# Patient Record
Sex: Female | Born: 1965 | Race: White | Hispanic: No | Marital: Married | Smoking: Never smoker
Health system: Southern US, Community
[De-identification: ages and names within clinical notes are randomized; demographics above are authoritative.]

---

## 2006-12-24 ENCOUNTER — Inpatient Hospital Stay (HOSPITAL_COMMUNITY): Admission: AD | Admit: 2006-12-24 | Discharge: 2006-12-26 | Payer: Self-pay | Admitting: Obstetrics & Gynecology

## 2007-02-04 ENCOUNTER — Ambulatory Visit: Admission: RE | Admit: 2007-02-04 | Discharge: 2007-02-04 | Payer: Self-pay | Admitting: Obstetrics & Gynecology

## 2008-11-25 ENCOUNTER — Inpatient Hospital Stay (HOSPITAL_COMMUNITY): Admission: AD | Admit: 2008-11-25 | Discharge: 2008-11-25 | Payer: Self-pay | Admitting: Obstetrics

## 2008-11-27 ENCOUNTER — Encounter (INDEPENDENT_AMBULATORY_CARE_PROVIDER_SITE_OTHER): Payer: Self-pay | Admitting: Obstetrics

## 2008-11-27 ENCOUNTER — Inpatient Hospital Stay (HOSPITAL_COMMUNITY): Admission: AD | Admit: 2008-11-27 | Discharge: 2008-11-27 | Payer: Self-pay | Admitting: Obstetrics

## 2009-08-26 ENCOUNTER — Inpatient Hospital Stay (HOSPITAL_COMMUNITY): Admission: AD | Admit: 2009-08-26 | Discharge: 2009-08-28 | Payer: Self-pay | Admitting: Obstetrics and Gynecology

## 2009-08-29 ENCOUNTER — Encounter: Admission: RE | Admit: 2009-08-29 | Discharge: 2009-09-28 | Payer: Self-pay | Admitting: Obstetrics & Gynecology

## 2009-09-29 ENCOUNTER — Encounter: Admission: RE | Admit: 2009-09-29 | Discharge: 2009-10-19 | Payer: Self-pay | Admitting: Certified Nurse Midwife

## 2011-02-08 LAB — CBC
HCT: 29.9 % — ABNORMAL LOW (ref 36.0–46.0)
MCHC: 34.1 g/dL (ref 30.0–36.0)
MCV: 99.5 fL (ref 78.0–100.0)
MCV: 99.6 fL (ref 78.0–100.0)
Platelets: 182 10*3/uL (ref 150–400)
RBC: 3.83 MIL/uL — ABNORMAL LOW (ref 3.87–5.11)
RDW: 13.3 % (ref 11.5–15.5)
RDW: 13.5 % (ref 11.5–15.5)

## 2011-02-08 LAB — RPR: RPR Ser Ql: NONREACTIVE

## 2011-02-19 LAB — HCG, QUANTITATIVE, PREGNANCY: hCG, Beta Chain, Quant, S: 1994 m[IU]/mL — ABNORMAL HIGH (ref <5)

## 2012-05-26 ENCOUNTER — Other Ambulatory Visit: Payer: Self-pay | Admitting: Dermatology

## 2013-05-06 ENCOUNTER — Other Ambulatory Visit: Payer: Self-pay | Admitting: Dermatology

## 2014-09-29 ENCOUNTER — Other Ambulatory Visit: Payer: Self-pay | Admitting: Dermatology

## 2019-07-10 ENCOUNTER — Other Ambulatory Visit: Payer: Self-pay

## 2019-07-10 ENCOUNTER — Emergency Department (HOSPITAL_COMMUNITY): Payer: BC Managed Care – PPO

## 2019-07-10 ENCOUNTER — Encounter (HOSPITAL_COMMUNITY): Payer: Self-pay | Admitting: Obstetrics and Gynecology

## 2019-07-10 ENCOUNTER — Inpatient Hospital Stay (HOSPITAL_COMMUNITY)
Admission: EM | Admit: 2019-07-10 | Discharge: 2019-07-18 | DRG: 066 | Disposition: A | Discharge status: Home / Self Care | Payer: BC Managed Care – PPO | Attending: Neurosurgery | Admitting: Neurosurgery

## 2019-07-10 DIAGNOSIS — Z23 Encounter for immunization: Secondary | ICD-10-CM | POA: Diagnosis not present

## 2019-07-10 DIAGNOSIS — R51 Headache: Secondary | ICD-10-CM | POA: Diagnosis present

## 2019-07-10 DIAGNOSIS — I609 Nontraumatic subarachnoid hemorrhage, unspecified: Principal | ICD-10-CM | POA: Diagnosis present

## 2019-07-10 DIAGNOSIS — Z793 Long term (current) use of hormonal contraceptives: Secondary | ICD-10-CM | POA: Diagnosis not present

## 2019-07-10 DIAGNOSIS — Z79899 Other long term (current) drug therapy: Secondary | ICD-10-CM | POA: Diagnosis not present

## 2019-07-10 DIAGNOSIS — Z20828 Contact with and (suspected) exposure to other viral communicable diseases: Secondary | ICD-10-CM | POA: Diagnosis present

## 2019-07-10 LAB — CBC
HCT: 41 % (ref 36.0–46.0)
Hemoglobin: 13.8 g/dL (ref 12.0–15.0)
MCH: 32.5 pg (ref 26.0–34.0)
MCHC: 33.7 g/dL (ref 30.0–36.0)
MCV: 96.7 fL (ref 80.0–100.0)
Platelets: 212 10*3/uL (ref 150–400)
RBC: 4.24 MIL/uL (ref 3.87–5.11)
RDW: 12.6 % (ref 11.5–15.5)
WBC: 8.1 10*3/uL (ref 4.0–10.5)
nRBC: 0 % (ref 0.0–0.2)

## 2019-07-10 LAB — BASIC METABOLIC PANEL
Anion gap: 7 (ref 5–15)
BUN: 19 mg/dL (ref 6–20)
CO2: 26 mmol/L (ref 22–32)
Calcium: 9.2 mg/dL (ref 8.9–10.3)
Chloride: 105 mmol/L (ref 98–111)
Creatinine, Ser: 0.88 mg/dL (ref 0.44–1.00)
GFR calc Af Amer: >60 mL/min (ref >60)
GFR calc non Af Amer: >60 mL/min (ref >60)
Glucose, Bld: 127 mg/dL — ABNORMAL HIGH (ref 70–99)
Potassium: 3.8 mmol/L (ref 3.5–5.1)
Sodium: 138 mmol/L (ref 135–145)

## 2019-07-10 LAB — PROTIME-INR
INR: 1.1 (ref 0.8–1.2)
Prothrombin Time: 14.2 seconds (ref 11.4–15.2)

## 2019-07-10 LAB — SARS CORONAVIRUS 2 BY RT PCR (HOSPITAL ORDER, PERFORMED IN ~~LOC~~ HOSPITAL LAB): SARS Coronavirus 2: NEGATIVE

## 2019-07-10 LAB — MRSA PCR SCREENING: MRSA by PCR: NEGATIVE

## 2019-07-10 MED ORDER — IOHEXOL 350 MG/ML SOLN
80.0000 mL | Freq: Once | INTRAVENOUS | Status: AC | PRN
  Administered 2019-07-10: 100 mL via INTRAVENOUS

## 2019-07-10 MED ORDER — MORPHINE SULFATE (PF) 4 MG/ML IV SOLN
4.0000 mg | Freq: Once | INTRAVENOUS | Status: AC
  Administered 2019-07-10: 4 mg via INTRAVENOUS
  Filled 2019-07-10: qty 1

## 2019-07-10 MED ORDER — ONDANSETRON HCL 4 MG/2ML IJ SOLN
4.0000 mg | Freq: Four times a day (QID) | INTRAMUSCULAR | Status: DC | PRN
  Administered 2019-07-15 – 2019-07-17 (×2): 4 mg via INTRAVENOUS
  Filled 2019-07-10 (×2): qty 2

## 2019-07-10 MED ORDER — MORPHINE SULFATE (PF) 4 MG/ML IV SOLN
4.0000 mg | Freq: Once | INTRAVENOUS | Status: AC
  Administered 2019-07-10: 15:00:00 4 mg via INTRAVENOUS
  Filled 2019-07-10: qty 1

## 2019-07-10 MED ORDER — SODIUM CHLORIDE 0.9 % IV SOLN
INTRAVENOUS | Status: DC
  Administered 2019-07-11 – 2019-07-17 (×7): via INTRAVENOUS

## 2019-07-10 MED ORDER — DIPHENHYDRAMINE HCL 50 MG/ML IJ SOLN
12.5000 mg | Freq: Once | INTRAMUSCULAR | Status: AC
  Administered 2019-07-10: 12.5 mg via INTRAVENOUS
  Filled 2019-07-10: qty 1

## 2019-07-10 MED ORDER — HYDROCODONE-ACETAMINOPHEN 5-325 MG PO TABS
1.0000 | ORAL_TABLET | ORAL | Status: DC | PRN
  Administered 2019-07-10 – 2019-07-15 (×19): 1 via ORAL
  Administered 2019-07-15 (×2): 2 via ORAL
  Administered 2019-07-15: 05:00:00 1 via ORAL
  Administered 2019-07-16 – 2019-07-18 (×11): 2 via ORAL
  Filled 2019-07-10 (×3): qty 2
  Filled 2019-07-10: qty 1
  Filled 2019-07-10: qty 2
  Filled 2019-07-10 (×3): qty 1
  Filled 2019-07-10: qty 2
  Filled 2019-07-10: qty 1
  Filled 2019-07-10: qty 2
  Filled 2019-07-10 (×3): qty 1
  Filled 2019-07-10: qty 2
  Filled 2019-07-10: qty 1
  Filled 2019-07-10 (×2): qty 2
  Filled 2019-07-10 (×5): qty 1
  Filled 2019-07-10: qty 2
  Filled 2019-07-10 (×3): qty 1
  Filled 2019-07-10 (×3): qty 2
  Filled 2019-07-10 (×3): qty 1
  Filled 2019-07-10 (×3): qty 2

## 2019-07-10 MED ORDER — PROCHLORPERAZINE EDISYLATE 10 MG/2ML IJ SOLN
10.0000 mg | Freq: Once | INTRAMUSCULAR | Status: AC
  Administered 2019-07-10: 10 mg via INTRAVENOUS
  Filled 2019-07-10: qty 2

## 2019-07-10 MED ORDER — SENNA 8.6 MG PO TABS
1.0000 | ORAL_TABLET | Freq: Two times a day (BID) | ORAL | Status: DC
  Administered 2019-07-10 – 2019-07-18 (×10): 8.6 mg via ORAL
  Filled 2019-07-10 (×11): qty 1

## 2019-07-10 MED ORDER — ACETAMINOPHEN 325 MG PO TABS
650.0000 mg | ORAL_TABLET | Freq: Four times a day (QID) | ORAL | Status: DC | PRN
  Administered 2019-07-16: 09:00:00 650 mg via ORAL
  Filled 2019-07-10 (×2): qty 2

## 2019-07-10 MED ORDER — ACETAMINOPHEN 650 MG RE SUPP: 650.0000 mg | Freq: Four times a day (QID) | RECTAL | Status: DC | PRN

## 2019-07-10 MED ORDER — NIMODIPINE 30 MG PO CAPS
30.0000 mg | ORAL_CAPSULE | ORAL | Status: DC
  Administered 2019-07-10 – 2019-07-11 (×5): 30 mg via ORAL
  Filled 2019-07-10 (×5): qty 1

## 2019-07-10 MED ORDER — DOCUSATE SODIUM 100 MG PO CAPS
100.0000 mg | ORAL_CAPSULE | Freq: Two times a day (BID) | ORAL | Status: DC
  Administered 2019-07-10 – 2019-07-18 (×12): 100 mg via ORAL
  Filled 2019-07-10 (×13): qty 1

## 2019-07-10 MED ORDER — MORPHINE SULFATE (PF) 2 MG/ML IV SOLN
2.0000 mg | INTRAVENOUS | Status: DC | PRN
  Administered 2019-07-15 – 2019-07-16 (×2): 2 mg via INTRAVENOUS
  Filled 2019-07-10 (×2): qty 1

## 2019-07-10 MED ORDER — SODIUM CHLORIDE 0.9 % IV SOLN
INTRAVENOUS | Status: DC
  Administered 2019-07-10: 14:00:00 via INTRAVENOUS

## 2019-07-10 MED ORDER — CHLORHEXIDINE GLUCONATE CLOTH 2 % EX PADS
6.0000 | MEDICATED_PAD | Freq: Every day | CUTANEOUS | Status: DC
  Administered 2019-07-10 – 2019-07-16 (×5): 6 via TOPICAL

## 2019-07-10 MED ORDER — ONDANSETRON HCL 4 MG PO TABS: 4.0000 mg | ORAL_TABLET | Freq: Four times a day (QID) | ORAL | Status: DC | PRN

## 2019-07-10 NOTE — ED Notes (Signed)
[facility] called for patient transport to [facility].

## 2019-07-10 NOTE — ED Provider Notes (Signed)
[facility]-EMERGENCY DEPT Provider Note   CSN: [id number] Arrival date & time: 07/10/19  1216     History   Chief Complaint Chief Complaint  Patient presents with   Headache   Nausea   Neck Pain    HPI Ashley Case is a 53 y.o. female.     HPI Pt was talking on the phone when she had sudden onset of severe pressure in her head around 11 am today.  Prior to that she was completely fine. .  It is in the back of her head and goes down her neck.  She has felt nauseated.  No vomiting.  Some photophobia.  No trouble walking.  No trouble moving arms or legs.  Her upper arms feel funny but they are not numb. She has had trouble with intermittent headaches but not a consistent problem.  She has not had one in at least a month or two.   History reviewed. No pertinent past medical history.  Patient Active Problem List   Diagnosis Date Noted   SAH (subarachnoid hemorrhage) (HCC) 07/10/2019    History reviewed. No pertinent surgical history.   OB History   No obstetric history on file.      Home Medications    Prior to Admission medications   Medication Sig Start Date End Date Taking? Authorizing Provider  ibuprofen (ADVIL) 200 MG tablet Take 400 mg by mouth every 6 (six) hours as needed for fever, headache or moderate pain.   Yes [provider]  LO LOESTRIN FE 1 MG-10 MCG / 10 MCG tablet Take 1 tablet by mouth daily. 06/02/19  Yes [provider]  Multiple Vitamin (MULTIVITAMIN WITH MINERALS) TABS tablet Take 2 tablets by mouth daily.   Yes [provider]    Family History History reviewed. No pertinent family history.  Social History Social History   Tobacco Use   Smoking status: Never Smoker   Smokeless tobacco: Never Used  Substance Use Topics   Alcohol use: Yes    Comment: Daily wine/beer with dinner   Drug use: Never     Allergies   Patient has no known allergies.   Review of Systems Review of  Systems  Constitutional: Negative for fever.  Respiratory: Negative for cough.   Musculoskeletal: Positive for neck pain.  Neurological: Positive for light-headedness and headaches.  All other systems reviewed and are negative.    Physical Exam Updated Vital Signs BP 119/69 (BP Location: Right Arm)    Pulse 66    Temp 97.6 F (36.4 C) (Oral)    Resp 16    Wt 52.2 kg    SpO2 100%   Physical Exam Vitals signs and nursing note reviewed.  Constitutional:      General: She is not in acute distress.    Appearance: She is well-developed.  HENT:     Head: Normocephalic and atraumatic.     Right Ear: External ear normal.     Left Ear: External ear normal.  Eyes:     General: No scleral icterus.       Right eye: No discharge.        Left eye: No discharge.     Conjunctiva/sclera: Conjunctivae normal.  Neck:     Musculoskeletal: Neck supple.     Trachea: No tracheal deviation.     Comments: Decreased range of motion Cardiovascular:     Rate and Rhythm: Normal rate and regular rhythm.  Pulmonary:     Effort: Pulmonary effort  is normal. No respiratory distress.     Breath sounds: Normal breath sounds. No stridor. No wheezing or rales.  Abdominal:     General: Bowel sounds are normal. There is no distension.     Palpations: Abdomen is soft.     Tenderness: There is no abdominal tenderness. There is no guarding or rebound.  Musculoskeletal:        General: No tenderness.  Skin:    General: Skin is warm and dry.     Findings: No rash.  Neurological:     Mental Status: She is alert.     Cranial Nerves: No cranial nerve deficit (no facial droop, extraocular movements intact, no slurred speech).     Sensory: No sensory deficit.     Motor: No abnormal muscle tone or seizure activity.     Coordination: Coordination normal.      ED Treatments / Results  Labs (all labs ordered are listed, but only abnormal results are displayed) Labs Reviewed  BASIC METABOLIC PANEL - Abnormal;  Notable for the following components:      Result Value   Glucose, Bld 127 (*)    All other components within normal limits  SARS CORONAVIRUS 2 (HOSPITAL ORDER, [facility])  CBC  PROTIME-INR  HIV ANTIBODY (ROUTINE TESTING W REFLEX)    EKG None  Radiology Ct Angio Head W Or Wo Contrast  Result Date: 07/10/2019 CLINICAL DATA:  Subarachnoid hemorrhage, severe headache with nausea EXAM: CT ANGIOGRAPHY HEAD AND NECK TECHNIQUE: Multidetector CT imaging of the head and neck was performed using the standard protocol during bolus administration of intravenous contrast. Multiplanar CT image reconstructions and MIPs were obtained to evaluate the vascular anatomy. Carotid stenosis measurements (when applicable) are obtained utilizing NASCET criteria, using the distal internal carotid diameter as the denominator. CONTRAST:  174mL OMNIPAQUE IOHEXOL 350 MG/ML SOLN COMPARISON:  Concurrent noncontrast head CT 07/10/2019. FINDINGS: CTA NECK FINDINGS Aortic arch: The left vertebral artery arises directly from the aortic arch. The imaged aortic arch is otherwise unremarkable. No significant ostial stenosis of the major branch vessel origins. Right carotid system: The right common and cervical internal carotid arteries are patent without significant stenosis. There is only minimal calcified plaque at the carotid bifurcation. Left carotid system: The left common and cervical internal carotid arteries are patent without significant stenosis Vertebral arteries: The bilateral vertebral arteries are patent within the neck without significant stenosis. The vertebral arteries are codominant. Skeleton: No suspicious lytic or blastic osseous lesions. Cervical spondylosis with C5-C6 and C6-C7 posterior disc osteophytes. No high-grade bony spinal canal stenosis. Other neck: 7 mm nodule within the inferior right thyroid lobe, not meeting consensus criteria for ultrasound imaging follow-up. No soft tissue  neck mass or pathologically enlarged cervical chain lymph nodes. Upper chest: No confluent consolidation within the imaged lung apices. Review of the MIP images confirms the above findings CTA HEAD FINDINGS Anterior circulation: The intracranial internal carotid arteries are patent without significant stenosis. The right middle and anterior cerebral arteries are patent without significant proximal stenosis. The left middle and anterior cerebral arteries are patent without significant proximal stenosis. No intracranial aneurysm is identified. Posterior circulation: The intracranial vertebral arteries are patent without significant stenosis, as is the basilar artery. The bilateral posterior cerebral arteries are patent without significant proximal stenosis. Venous sinuses: Within the limitations of contrast timing, no evidence of thrombosis. Anatomic variants: None significant. Posterior communicating arteries are present bilaterally. Review of the MIP images confirms the above findings  IMPRESSION: CTA head: Unremarkable CT angiography of the head. No culprit aneurysm for subarachnoid hemorrhage is identified. Consider catheter based angiography for further evaluation. No large vessel occlusion or proximal high-grade arterial stenosis. CTA neck: The common carotid, internal carotid and vertebral arteries are widely patent within the neck bilaterally. No aneurysm or significant vessel irregularity. Electronically Signed   By: Jackey LogeKyle  Golden   On: 07/10/2019 15:29   Ct Head Wo Contrast  Addendum Date: 07/10/2019   ADDENDUM REPORT: 07/10/2019 15:30 ADDENDUM: Critical Value/emergent results were called by telephone at the time of interpretation on 07/10/2019 at 3:21 pm to Dr. Linwood DibblesJON Elynor Kallenberger , who verbally acknowledged these results. Electronically Signed   By: Genevive BiStewart  Edmunds M.D.   On: 07/10/2019 15:30   Result Date: 07/10/2019 CLINICAL DATA:  Severe headache no history of migrainesHeadache, acute, severe, worst HA of life  onset around 11 am EXAM: CT HEAD WITHOUT CONTRAST TECHNIQUE: Contiguous axial images were obtained from the base of the skull through the vertex without intravenous contrast. COMPARISON:  None. FINDINGS: Brain: There is high-density extra-axial blood along the quadrigeminal plate cisterns and along the suprasellar cistern. Findings consistent with acute subarachnoid hemorrhage. No intraventricular hemorrhage. No parenchymal hemorrhage identified. Hydrocephalus. Vascular: No hypodense enlarged vessel identified. Skull: Normal. Negative for fracture or focal lesion. Sinuses/Orbits: Paranasal sinuses and mastoid air cells are clear. Orbits are clear. Other: None. IMPRESSION: 1. Acute subarachnoid hemorrhage in the basal cisterns. Patient subsequently underwent a same day CT angiogram. Recommend attention to findings of the subsequent study. 2. No hydrocephalus. No intraventricular hemorrhage. No parenchymal hemorrhage. Electronically Signed: By: Genevive BiStewart  Edmunds M.D. On: 07/10/2019 15:16   Ct Angio Neck W And/or Wo Contrast  Result Date: 07/10/2019 CLINICAL DATA:  Subarachnoid hemorrhage, severe headache with nausea EXAM: CT ANGIOGRAPHY HEAD AND NECK TECHNIQUE: Multidetector CT imaging of the head and neck was performed using the standard protocol during bolus administration of intravenous contrast. Multiplanar CT image reconstructions and MIPs were obtained to evaluate the vascular anatomy. Carotid stenosis measurements (when applicable) are obtained utilizing NASCET criteria, using the distal internal carotid diameter as the denominator. CONTRAST:  100mL OMNIPAQUE IOHEXOL 350 MG/ML SOLN COMPARISON:  Concurrent noncontrast head CT 07/10/2019. FINDINGS: CTA NECK FINDINGS Aortic arch: The left vertebral artery arises directly from the aortic arch. The imaged aortic arch is otherwise unremarkable. No significant ostial stenosis of the major branch vessel origins. Right carotid system: The right common and cervical  internal carotid arteries are patent without significant stenosis. There is only minimal calcified plaque at the carotid bifurcation. Left carotid system: The left common and cervical internal carotid arteries are patent without significant stenosis Vertebral arteries: The bilateral vertebral arteries are patent within the neck without significant stenosis. The vertebral arteries are codominant. Skeleton: No suspicious lytic or blastic osseous lesions. Cervical spondylosis with C5-C6 and C6-C7 posterior disc osteophytes. No high-grade bony spinal canal stenosis. Other neck: 7 mm nodule within the inferior right thyroid lobe, not meeting consensus criteria for ultrasound imaging follow-up. No soft tissue neck mass or pathologically enlarged cervical chain lymph nodes. Upper chest: No confluent consolidation within the imaged lung apices. Review of the MIP images confirms the above findings CTA HEAD FINDINGS Anterior circulation: The intracranial internal carotid arteries are patent without significant stenosis. The right middle and anterior cerebral arteries are patent without significant proximal stenosis. The left middle and anterior cerebral arteries are patent without significant proximal stenosis. No intracranial aneurysm is identified. Posterior circulation: The intracranial vertebral arteries are patent without  significant stenosis, as is the basilar artery. The bilateral posterior cerebral arteries are patent without significant proximal stenosis. Venous sinuses: Within the limitations of contrast timing, no evidence of thrombosis. Anatomic variants: None significant. Posterior communicating arteries are present bilaterally. Review of the MIP images confirms the above findings IMPRESSION: CTA head: Unremarkable CT angiography of the head. No culprit aneurysm for subarachnoid hemorrhage is identified. Consider catheter based angiography for further evaluation. No large vessel occlusion or proximal high-grade  arterial stenosis. CTA neck: The common carotid, internal carotid and vertebral arteries are widely patent within the neck bilaterally. No aneurysm or significant vessel irregularity. Electronically Signed   By: Jackey Loge   On: 07/10/2019 15:29    Procedures .Critical Care Performed by: Linwood Dibbles, MD Authorized by: Linwood Dibbles, MD   Critical care provider statement:    Critical care time (minutes):  45   Critical care was time spent personally by me on the following activities:  Discussions with consultants, evaluation of patient's response to treatment, examination of patient, ordering and performing treatments and interventions, ordering and review of laboratory studies, ordering and review of radiographic studies, pulse oximetry, re-evaluation of patient's condition, obtaining history from patient or surrogate and review of old charts   (including critical care time)  Medications Ordered in ED Medications  0.9 %  sodium chloride infusion ( Intravenous New Bag/Given 07/10/19 1334)  0.9 %  sodium chloride infusion (has no administration in time range)  acetaminophen (TYLENOL) tablet 650 mg (has no administration in time range)    Or  acetaminophen (TYLENOL) suppository 650 mg (has no administration in time range)  HYDROcodone-acetaminophen (NORCO/VICODIN) 5-325 MG per tablet 1-2 tablet (has no administration in time range)  morphine 2 MG/ML injection 2 mg (has no administration in time range)  docusate sodium (COLACE) capsule 100 mg (has no administration in time range)  senna (SENOKOT) tablet 8.6 mg (has no administration in time range)  ondansetron (ZOFRAN) tablet 4 mg (has no administration in time range)    Or  ondansetron (ZOFRAN) injection 4 mg (has no administration in time range)  niMODipine (NIMOTOP) capsule 30 mg (has no administration in time range)  prochlorperazine (COMPAZINE) injection 10 mg (10 mg Intravenous Given 07/10/19 1334)  diphenhydrAMINE (BENADRYL) injection 12.5  mg (12.5 mg Intravenous Given 07/10/19 1334)  morphine 4 MG/ML injection 4 mg (4 mg Intravenous Given 07/10/19 1334)  iohexol (OMNIPAQUE) 350 MG/ML injection 80 mL (100 mLs Intravenous Contrast Given 07/10/19 1437)  morphine 4 MG/ML injection 4 mg (4 mg Intravenous Given 07/10/19 1514)     Initial Impression / Assessment and Plan / ED Course  I have reviewed the triage vital signs and the nursing notes.  Pertinent labs & imaging results that were available during my care of the patient were reviewed by me and considered in my medical decision making (see chart for details).  Clinical Course as of Jul 10 1599  Fri Jul 10, 2019  1435 Preliminary report from radiology indicates the patient appears to have a subarachnoid hemorrhage.  Initial laboratory tests are unremarkable.  CT angiogram has also been ordered.  Formal radiology interpretation is pending.  I will consult with neurosurgery in the meantime as we proceed with the rest of her imaging tests.   [JK]  1453 Reviewed the preliminary findings with the patient.  She remains alert and oriented.  GCS 15.  She does request additional pain medications.   [JK]  1511 Case discussed with Dr. Wynetta Emery.  He will evaluate  the patient at [facility] ED.  I have placed orders for admission to 4 N. ICU at [facility].  Formal imaging results are pending.   [JK]    Clinical Course User Index [JK] Linwood Dibbles, MD     Patient presents to the ED with complaints of sudden onset of headache.  Symptoms were concerning for possible subarachnoid hemorrhage.  CT scan confirmed that suspicion.  Patient's mental status fortunately has remained intact.  Her headache has improved with treatment.  CT angiogram was performed.  Neurosurgery, Dr. Wynetta Emery was contacted.  We will continue to monitor closely.  Patient will be transferred to [facility] for admission to the neuro ICU for further treatment and evaluation.  Final Clinical Impressions(s) / ED Diagnoses   Final diagnoses:    Subarachnoid hemorrhage (HCC)      Linwood Dibbles, MD 07/10/19 1600

## 2019-07-10 NOTE — Progress Notes (Signed)
Patient ID: Ashley Case, female   DOB: 1965/12/11, 53 y.o.   MRN: [id number] Patient continues to do very well arrived on transfer from [place] [facility].  Headache much better on medicine minimal nausea...  Awake alert [place] x4 strength 5 out of 5 upper and lower extremities no pronator drift cranial nerves intact  Continue subarachnoid hemorrhage precautions and management.

## 2019-07-10 NOTE — ED Triage Notes (Signed)
Patient reports tinnitus, neck tenderness and stiffness, headache, nausea and dizziness.  Patient denies any pertinent medical hx. Pt is coming from work.  Patient reports she has "had headaches before but nothing like this"

## 2019-07-10 NOTE — H&P (Addendum)
Subjective:   Patient is a 53 y.o. female  Presented to the ED after she experiences a severe headache while on the phone with her friend. She reports some nausea but no vomiting. Denies any vision changes. Her headache is a 8/10, constant, and throbbing. It is the "worse headache of her life."   History reviewed. No pertinent past medical history.  History reviewed. No pertinent surgical history.  No Known Allergies  Social History   Tobacco Use  . Smoking status: Never Smoker  . Smokeless tobacco: Never Used  Substance Use Topics  . Alcohol use: Yes    Comment: Daily wine/beer with dinner    History reviewed. No pertinent family history. Prior to Admission medications   Medication Sig Start Date End Date Taking? Authorizing Provider  ibuprofen (ADVIL) 200 MG tablet Take 400 mg by mouth every 6 (six) hours as needed for fever, headache or moderate pain.   Yes [provider]  LO LOESTRIN FE 1 MG-10 MCG / 10 MCG tablet Take 1 tablet by mouth daily. 06/02/19  Yes [provider]  Multiple Vitamin (MULTIVITAMIN WITH MINERALS) TABS tablet Take 2 tablets by mouth daily.   Yes [provider]     Review of Systems  Positive ROS: as above  All other systems have been reviewed and were otherwise negative with the exception of those mentioned in the HPI and as above.  Objective: Vital signs in last 24 hours: Temp:  [97.6 F (36.4 C)] 97.6 F (36.4 C) (09/04 1225) Pulse Rate:  [55-71] 66 (09/04 1500) Resp:  [15-16] 16 (09/04 1500) BP: (114-131)/(63-84) 119/69 (09/04 1500) SpO2:  [100 %] 100 % (09/04 1500) Weight:  [52.2 kg] 52.2 kg (09/04 1524)  General Appearance: Alert, cooperative, no distress, appears stated age Head: Normocephalic, without obvious abnormality, atraumatic Eyes: PERRL, conjunctiva/corneas clear, EOM's intact, fundi benign, both eyes      Ears: Normal TM's and external ear canals, both ears Throat: Lips, mucosa, and tongue normal;  teeth and gums normal Neck: Supple, symmetrical, trachea midline, no adenopathy; thyroid: No enlargement/tenderness/nodules; no carotid bruit or JVD Back: Symmetric, no curvature, ROM normal, no CVA tenderness Lungs: Clear to auscultation bilaterally, respirations unlabored Heart: Regular rate and rhythm, S1 and S2 normal, no murmur, rub or gallop Abdomen: Soft, non-tender, bowel sounds active all four quadrants, no masses, no organomegaly Extremities: Extremities normal, atraumatic, no cyanosis or edema Pulses: 2+ and symmetric all extremities Skin: Skin color, texture, turgor normal, no rashes or lesions  NEUROLOGIC:   Mental status: A&O x4, no aphasia, good AS, fund of knowledge and memory Motor Exam - grossly normal Sensory Exam - grossly normal Reflexes: Coordination - grossly normal Gait - not tested Balance - not tested Cranial Nerves: I: smell Not tested  II: visual acuity  OS: na    OD: na  II: visual fields Full to confrontation  II: pupils Equal, round, reactive to light  III,VII: ptosis None  III,IV,VI: extraocular muscles  Full ROM  V: mastication Normal  V: facial light touch sensation  Normal  V,VII: corneal reflex  Present  VII: facial muscle function - upper  Normal  VII: facial muscle function - lower Normal  VIII: hearing Not tested  IX: soft palate elevation  Normal  IX,X: gag reflex Present  XI: trapezius strength  5/5  XI: sternocleidomastoid strength 5/5  XI: neck flexion strength  5/5  XII: tongue strength  Normal    Data Review Lab Results  Component Value Date  WBC 8.1 07/10/2019   HGB 13.8 07/10/2019   HCT 41.0 07/10/2019   MCV 96.7 07/10/2019   PLT 212 07/10/2019   Lab Results  Component Value Date   NA 138 07/10/2019   K 3.8 07/10/2019   CL 105 07/10/2019   CO2 26 07/10/2019   BUN 19 07/10/2019   CREATININE 0.88 07/10/2019   GLUCOSE 127 (H) 07/10/2019   Lab Results  Component Value Date   INR 1.1 07/10/2019     Assessment/Plan: 53 year old female presented to the ED after experiences a severe headache with nausea. CT head revealed an acute SAH in the basal cisterns. CTA negative for any aneurysms. We will plan to admit her to [facility] [place]. Will likely have an angiogram by Dr. Conchita ParisNundkumar.   Tiana LoftKimberly Hannah Meyran 07/10/2019 3:32 PM   Agree with above

## 2019-07-11 MED ORDER — NIMODIPINE 30 MG PO CAPS
60.0000 mg | ORAL_CAPSULE | ORAL | Status: DC
  Administered 2019-07-11 – 2019-07-18 (×41): 60 mg via ORAL
  Filled 2019-07-11 (×40): qty 2

## 2019-07-11 NOTE — Progress Notes (Signed)
Subjective: Patient reports Patient doing well still with neck pain and headache but otherwise nonfocal  Objective: Vital signs in last 24 hours: Temp:  [97.6 F (36.4 C)-98.6 F (37 C)] 98.6 F (37 C) (09/05 0805) Pulse Rate:  [50-80] 56 (09/05 0900) Resp:  [13-25] 17 (09/05 0900) BP: (91-149)/(56-84) 105/61 (09/05 0900) SpO2:  [96 %-100 %] 100 % (09/05 0900) Weight:  [52.2 kg] 52.2 kg (09/04 1524)  Intake/Output from previous day: 09/04 0701 - 09/05 0700 In: 443.8 [P.O.:240; I.V.:203.8] Out: -  Intake/Output this shift: Total I/O In: 1059.5 [I.V.:1059.5] Out: -   Strength 5 out of 5 no pronator drift awake and alert  Lab Results: Recent Labs    07/10/19 1323  WBC 8.1  HGB 13.8  HCT 41.0  PLT 212   BMET Recent Labs    07/10/19 1323  NA 138  K 3.8  CL 105  CO2 26  GLUCOSE 127*  BUN 19  CREATININE 0.88  CALCIUM 9.2    Studies/Results: Ct Angio Head W Or Wo Contrast  Result Date: 07/10/2019 CLINICAL DATA:  Subarachnoid hemorrhage, severe headache with nausea EXAM: CT ANGIOGRAPHY HEAD AND NECK TECHNIQUE: Multidetector CT imaging of the head and neck was performed using the standard protocol during bolus administration of intravenous contrast. Multiplanar CT image reconstructions and MIPs were obtained to evaluate the vascular anatomy. Carotid stenosis measurements (when applicable) are obtained utilizing NASCET criteria, using the distal internal carotid diameter as the denominator. CONTRAST:  100mL OMNIPAQUE IOHEXOL 350 MG/ML SOLN COMPARISON:  Concurrent noncontrast head CT 07/10/2019. FINDINGS: CTA NECK FINDINGS Aortic arch: The left vertebral artery arises directly from the aortic arch. The imaged aortic arch is otherwise unremarkable. No significant ostial stenosis of the major branch vessel origins. Right carotid system: The right common and cervical internal carotid arteries are patent without significant stenosis. There is only minimal calcified plaque at the  carotid bifurcation. Left carotid system: The left common and cervical internal carotid arteries are patent without significant stenosis Vertebral arteries: The bilateral vertebral arteries are patent within the neck without significant stenosis. The vertebral arteries are codominant. Skeleton: No suspicious lytic or blastic osseous lesions. Cervical spondylosis with C5-C6 and C6-C7 posterior disc osteophytes. No high-grade bony spinal canal stenosis. Other neck: 7 mm nodule within the inferior right thyroid lobe, not meeting consensus criteria for ultrasound imaging follow-up. No soft tissue neck mass or pathologically enlarged cervical chain lymph nodes. Upper chest: No confluent consolidation within the imaged lung apices. Review of the MIP images confirms the above findings CTA HEAD FINDINGS Anterior circulation: The intracranial internal carotid arteries are patent without significant stenosis. The right middle and anterior cerebral arteries are patent without significant proximal stenosis. The left middle and anterior cerebral arteries are patent without significant proximal stenosis. No intracranial aneurysm is identified. Posterior circulation: The intracranial vertebral arteries are patent without significant stenosis, as is the basilar artery. The bilateral posterior cerebral arteries are patent without significant proximal stenosis. Venous sinuses: Within the limitations of contrast timing, no evidence of thrombosis. Anatomic variants: None significant. Posterior communicating arteries are present bilaterally. Review of the MIP images confirms the above findings IMPRESSION: CTA head: Unremarkable CT angiography of the head. No culprit aneurysm for subarachnoid hemorrhage is identified. Consider catheter based angiography for further evaluation. No large vessel occlusion or proximal high-grade arterial stenosis. CTA neck: The common carotid, internal carotid and vertebral arteries are widely patent within  the neck bilaterally. No aneurysm or significant vessel irregularity. Electronically Signed  By: Kellie Simmering   On: 07/10/2019 15:29   Ct Head Wo Contrast  Addendum Date: 07/10/2019   ADDENDUM REPORT: 07/10/2019 15:30 ADDENDUM: Critical Value/emergent results were called by telephone at the time of interpretation on 07/10/2019 at 3:21 pm to Dr. Dorie Rank , who verbally acknowledged these results. Electronically Signed   By: Suzy Bouchard M.D.   On: 07/10/2019 15:30   Result Date: 07/10/2019 CLINICAL DATA:  Severe headache no history of migrainesHeadache, acute, severe, worst HA of life onset around 11 am EXAM: CT HEAD WITHOUT CONTRAST TECHNIQUE: Contiguous axial images were obtained from the base of the skull through the vertex without intravenous contrast. COMPARISON:  None. FINDINGS: Brain: There is high-density extra-axial blood along the quadrigeminal plate cisterns and along the suprasellar cistern. Findings consistent with acute subarachnoid hemorrhage. No intraventricular hemorrhage. No parenchymal hemorrhage identified. Hydrocephalus. Vascular: No hypodense enlarged vessel identified. Skull: Normal. Negative for fracture or focal lesion. Sinuses/Orbits: Paranasal sinuses and mastoid air cells are clear. Orbits are clear. Other: None. IMPRESSION: 1. Acute subarachnoid hemorrhage in the basal cisterns. Patient subsequently underwent a same day CT angiogram. Recommend attention to findings of the subsequent study. 2. No hydrocephalus. No intraventricular hemorrhage. No parenchymal hemorrhage. Electronically Signed: By: Suzy Bouchard M.D. On: 07/10/2019 15:16   Ct Angio Neck W And/or Wo Contrast  Result Date: 07/10/2019 CLINICAL DATA:  Subarachnoid hemorrhage, severe headache with nausea EXAM: CT ANGIOGRAPHY HEAD AND NECK TECHNIQUE: Multidetector CT imaging of the head and neck was performed using the standard protocol during bolus administration of intravenous contrast. Multiplanar CT image  reconstructions and MIPs were obtained to evaluate the vascular anatomy. Carotid stenosis measurements (when applicable) are obtained utilizing NASCET criteria, using the distal internal carotid diameter as the denominator. CONTRAST:  136mL OMNIPAQUE IOHEXOL 350 MG/ML SOLN COMPARISON:  Concurrent noncontrast head CT 07/10/2019. FINDINGS: CTA NECK FINDINGS Aortic arch: The left vertebral artery arises directly from the aortic arch. The imaged aortic arch is otherwise unremarkable. No significant ostial stenosis of the major branch vessel origins. Right carotid system: The right common and cervical internal carotid arteries are patent without significant stenosis. There is only minimal calcified plaque at the carotid bifurcation. Left carotid system: The left common and cervical internal carotid arteries are patent without significant stenosis Vertebral arteries: The bilateral vertebral arteries are patent within the neck without significant stenosis. The vertebral arteries are codominant. Skeleton: No suspicious lytic or blastic osseous lesions. Cervical spondylosis with C5-C6 and C6-C7 posterior disc osteophytes. No high-grade bony spinal canal stenosis. Other neck: 7 mm nodule within the inferior right thyroid lobe, not meeting consensus criteria for ultrasound imaging follow-up. No soft tissue neck mass or pathologically enlarged cervical chain lymph nodes. Upper chest: No confluent consolidation within the imaged lung apices. Review of the MIP images confirms the above findings CTA HEAD FINDINGS Anterior circulation: The intracranial internal carotid arteries are patent without significant stenosis. The right middle and anterior cerebral arteries are patent without significant proximal stenosis. The left middle and anterior cerebral arteries are patent without significant proximal stenosis. No intracranial aneurysm is identified. Posterior circulation: The intracranial vertebral arteries are patent without  significant stenosis, as is the basilar artery. The bilateral posterior cerebral arteries are patent without significant proximal stenosis. Venous sinuses: Within the limitations of contrast timing, no evidence of thrombosis. Anatomic variants: None significant. Posterior communicating arteries are present bilaterally. Review of the MIP images confirms the above findings IMPRESSION: CTA head: Unremarkable CT angiography of the head. No culprit  aneurysm for subarachnoid hemorrhage is identified. Consider catheter based angiography for further evaluation. No large vessel occlusion or proximal high-grade arterial stenosis. CTA neck: The common carotid, internal carotid and vertebral arteries are widely patent within the neck bilaterally. No aneurysm or significant vessel irregularity. Electronically Signed   By: Jackey Loge   On: 07/10/2019 15:29    Assessment/Plan: Subarachnoid hemorrhage post bleed day 1 grade 1 doing well CT angiogram negative continue observation plan angio next Tuesday Wednesday  LOS: 1 day     Claudio Mondry P 07/11/2019, 9:55 AM

## 2019-07-12 NOTE — Progress Notes (Signed)
Subjective: The patient is alert and pleasant.  She is in no apparent distress.  She admits to a mild headache and some neck pain.  Objective: Vital signs in last 24 hours: Temp:  [36.6 C-36.8 C] 36.8 C (09/06 0336) Pulse Rate:  [51-80] 71 (09/06 0600) Resp:  [11-43] 26 (09/06 0600) BP: (91-116)/(52-83) 113/61 (09/06 0600) SpO2:  [97 %-100 %] 100 % (09/06 0600) There is no height or weight on file to calculate BMI.   Intake/Output from previous day: 09/05 0701 - 09/06 0700 In: 2742.2 [P.O.:1040; I.V.:1702.2] Out: -  Intake/Output this shift: Total I/O In: 839.9 [I.V.:839.9] Out: -   Physical exam the patient is alert and oriented x3.  Her speech is normal.  Her strength is normal.  Lab Results: Recent Labs    07/10/19 1323  WBC 8.1  HGB 13.8  HCT 41.0  PLT 212   BMET Recent Labs    07/10/19 1323  NA 138  K 3.8  CL 105  CO2 26  GLUCOSE 127*  BUN 19  CREATININE 0.88  CALCIUM 9.2    Studies/Results: Ct Angio Head W Or Wo Contrast  Result Date: 07/10/2019 CLINICAL DATA:  Subarachnoid hemorrhage, severe headache with nausea EXAM: CT ANGIOGRAPHY HEAD AND NECK TECHNIQUE: Multidetector CT imaging of the head and neck was performed using the standard protocol during bolus administration of intravenous contrast. Multiplanar CT image reconstructions and MIPs were obtained to evaluate the vascular anatomy. Carotid stenosis measurements (when applicable) are obtained utilizing NASCET criteria, using the distal internal carotid diameter as the denominator. CONTRAST:  OMNIPAQUE IOHEXOL 350 MG/ML SOLN COMPARISON:  Concurrent noncontrast head CT 07/10/2019. FINDINGS: CTA NECK FINDINGS Aortic arch: The left vertebral artery arises directly from the aortic arch. The imaged aortic arch is otherwise unremarkable. No significant ostial stenosis of the major branch vessel origins. Right carotid system: The right common and cervical internal carotid arteries are patent without  significant stenosis. There is only minimal calcified plaque at the carotid bifurcation. Left carotid system: The left common and cervical internal carotid arteries are patent without significant stenosis Vertebral arteries: The bilateral vertebral arteries are patent within the neck without significant stenosis. The vertebral arteries are codominant. Skeleton: No suspicious lytic or blastic osseous lesions. Cervical spondylosis with C5-C6 and C6-C7 posterior disc osteophytes. No high-grade bony spinal canal stenosis. Other neck: 7 mm nodule within the inferior right thyroid lobe, not meeting consensus criteria for ultrasound imaging follow-up. No soft tissue neck mass or pathologically enlarged cervical chain lymph nodes. Upper chest: No confluent consolidation within the imaged lung apices. Review of the MIP images confirms the above findings CTA HEAD FINDINGS Anterior circulation: The intracranial internal carotid arteries are patent without significant stenosis. The right middle and anterior cerebral arteries are patent without significant proximal stenosis. The left middle and anterior cerebral arteries are patent without significant proximal stenosis. No intracranial aneurysm is identified. Posterior circulation: The intracranial vertebral arteries are patent without significant stenosis, as is the basilar artery. The bilateral posterior cerebral arteries are patent without significant proximal stenosis. Venous sinuses: Within the limitations of contrast timing, no evidence of thrombosis. Anatomic variants: None significant. Posterior communicating arteries are present bilaterally. Review of the MIP images confirms the above findings IMPRESSION: CTA head: Unremarkable CT angiography of the head. No culprit aneurysm for subarachnoid hemorrhage is identified. Consider catheter based angiography for further evaluation. No large vessel occlusion or proximal high-grade arterial stenosis. CTA neck: The common  carotid, internal carotid and vertebral arteries are  widely patent within the neck bilaterally. No aneurysm or significant vessel irregularity. Electronically Signed   By: Jackey LogeKyle  Golden   On: 07/10/2019 15:29   Ct Head Wo Contrast  Addendum Date: 07/10/2019   ADDENDUM REPORT: 07/10/2019 15:30 ADDENDUM: Critical Value/emergent results were called by telephone at the time of interpretation on 07/10/2019 at 3:21 pm to Dr. Linwood DibblesJON KNAPP , who verbally acknowledged these results. Electronically Signed   By: Genevive BiStewart  Edmunds M.D.   On: 07/10/2019 15:30   Result Date: 07/10/2019 CLINICAL DATA:  Severe headache no history of migrainesHeadache, acute, severe, worst HA of life onset around 11 am EXAM: CT HEAD WITHOUT CONTRAST TECHNIQUE: Contiguous axial images were obtained from the base of the skull through the vertex without intravenous contrast. COMPARISON:  None. FINDINGS: Brain: There is high-density extra-axial blood along the quadrigeminal plate cisterns and along the suprasellar cistern. Findings consistent with acute subarachnoid hemorrhage. No intraventricular hemorrhage. No parenchymal hemorrhage identified. Hydrocephalus. Vascular: No hypodense enlarged vessel identified. Skull: Normal. Negative for fracture or focal lesion. Sinuses/Orbits: Paranasal sinuses and mastoid air cells are clear. Orbits are clear. Other: None. IMPRESSION: 1. Acute subarachnoid hemorrhage in the basal cisterns. Patient subsequently underwent a same day CT angiogram. Recommend attention to findings of the subsequent study. 2. No hydrocephalus. No intraventricular hemorrhage. No parenchymal hemorrhage. Electronically Signed: By: Genevive BiStewart  Edmunds M.D. On: 07/10/2019 15:16   Ct Angio Neck W And/or Wo Contrast  Result Date: 07/10/2019 CLINICAL DATA:  Subarachnoid hemorrhage, severe headache with nausea EXAM: CT ANGIOGRAPHY HEAD AND NECK TECHNIQUE: Multidetector CT imaging of the head and neck was performed using the standard protocol during  bolus administration of intravenous contrast. Multiplanar CT image reconstructions and MIPs were obtained to evaluate the vascular anatomy. Carotid stenosis measurements (when applicable) are obtained utilizing NASCET criteria, using the distal internal carotid diameter as the denominator. CONTRAST:  100mL OMNIPAQUE IOHEXOL 350 MG/ML SOLN COMPARISON:  Concurrent noncontrast head CT 07/10/2019. FINDINGS: CTA NECK FINDINGS Aortic arch: The left vertebral artery arises directly from the aortic arch. The imaged aortic arch is otherwise unremarkable. No significant ostial stenosis of the major branch vessel origins. Right carotid system: The right common and cervical internal carotid arteries are patent without significant stenosis. There is only minimal calcified plaque at the carotid bifurcation. Left carotid system: The left common and cervical internal carotid arteries are patent without significant stenosis Vertebral arteries: The bilateral vertebral arteries are patent within the neck without significant stenosis. The vertebral arteries are codominant. Skeleton: No suspicious lytic or blastic osseous lesions. Cervical spondylosis with C5-C6 and C6-C7 posterior disc osteophytes. No high-grade bony spinal canal stenosis. Other neck: 7 mm nodule within the inferior right thyroid lobe, not meeting consensus criteria for ultrasound imaging follow-up. No soft tissue neck mass or pathologically enlarged cervical chain lymph nodes. Upper chest: No confluent consolidation within the imaged lung apices. Review of the MIP images confirms the above findings CTA HEAD FINDINGS Anterior circulation: The intracranial internal carotid arteries are patent without significant stenosis. The right middle and anterior cerebral arteries are patent without significant proximal stenosis. The left middle and anterior cerebral arteries are patent without significant proximal stenosis. No intracranial aneurysm is identified. Posterior  circulation: The intracranial vertebral arteries are patent without significant stenosis, as is the basilar artery. The bilateral posterior cerebral arteries are patent without significant proximal stenosis. Venous sinuses: Within the limitations of contrast timing, no evidence of thrombosis. Anatomic variants: None significant. Posterior communicating arteries are present bilaterally. Review of the MIP  images confirms the above findings IMPRESSION: CTA head: Unremarkable CT angiography of the head. No culprit aneurysm for subarachnoid hemorrhage is identified. Consider catheter based angiography for further evaluation. No large vessel occlusion or proximal high-grade arterial stenosis. CTA neck: The common carotid, internal carotid and vertebral arteries are widely patent within the neck bilaterally. No aneurysm or significant vessel irregularity. Electronically Signed   By: Kellie Simmering   On: 07/10/2019 15:29    Assessment/Plan: A gram-negative subarachnoid hemorrhage: The patient is doing well clinically.  I discussed the rationale for arteriogram later this week.  I have answered all her questions.  LOS: 2 days     Ophelia Charter 07/12/2019, 8:50 AM

## 2019-07-13 NOTE — Discharge Instructions (Signed)
Subarachnoid Hemorrhage  Subarachnoid hemorrhage is bleeding between the brain and the layer that covers the brain. The bleeding puts pressure on the brain, and it stops blood from going to some areas of the brain. If this bleeding is not treated, it may cause brain damage, stroke, or death. This is an emergency. You must be treated in the hospital right away. You are more likely to get this condition if you:  Smoke.  Have high blood pressure.  Drink too much alcohol.  Are older than age 52.  Are female, especially if you have stopped getting your period for a year or longer (menopause).  Have a family history of burst blood vessels (aneurysms).  Have a certain syndrome that leads to one of these: ? Kidney disease. ? Disease of tissues like bones, blood, and fat (connective tissues). Signs of this bleeding condition include:  Sudden, very bad headache. It may feel like the worst headache you have ever had.  Feeling sick to your stomach (nausea) or throwing up (vomiting), especially if you have other signs such as a headache.  Sudden weakness or loss of feeling (numbness) in your face, arm, or leg, especially on one side of the body.  Sudden trouble with any of these: ? Walking. ? Moving an arm or leg. ? Talking. ? Understanding what people say. ? Swallowing. ? Seeing out of one eye or both eyes.  Sudden confusion.  Seeing double.  Loss of balance.  Sensitivity to light.  Stiff neck.  Trouble staying awake.  Passing out (fainting). Follow these instructions at home: Medicines  Take over-the-counter and prescription medicines only as told by your doctor.  Do not take any medicines that contain aspirin or NSAIDs (like ibuprofen) unless your doctor says that it is safe to take them. Lifestyle  Do not use any products that have nicotine or tobacco. These include cigarettes and e-cigarettes. If you need help quitting, ask your doctor.  Limit alcohol to 1 drink a  day for nonpregnant women and 2 drinks a day for men. One drink is equal to: ? 12 oz of beer. ? 5 oz of wine. ? 1 oz of hard liquor. Eating and drinking  Ask your doctor if it is safe for you to eat and drink. You may need tests to make sure that you can swallow safely (swallow studies). Driving  Do not drive until your doctor says that it is safe to drive.  Do not drive or use heavy machinery while taking prescription pain medicine. General instructions  Do therapy as recommended. This may include: ? Physical therapy (PT). ? Occupational therapy (OT). ? Speech-language therapy.  Rest and limit activity as told by your doctor. Rest helps your brain to heal. Make sure you: ? Get plenty of sleep. ? Avoid activities that cause stress to your body or mind.  Check your blood pressure as told by your doctor. Write down your blood pressure.  Keep all follow-up visits as told by your doctors. This is important. Contact a doctor if:  You have a stiff neck.  You have a cough.  You have a fever. Get help right away if:  You have any signs of a stroke. "BE FAST" is an easy way to remember the main warning signs: ? B - Balance. Signs are dizziness, sudden trouble walking, or loss of balance. ? E - Eyes. Signs are trouble seeing or a sudden change in how you see. ? F - Face. Signs are sudden weakness or loss  of feeling of the face, or the face or eyelid drooping on one side. ? A - Arms. Signs are weakness or loss of feeling in an arm. This happens suddenly and usually on one side of the body. ? S - Speech. Signs are sudden trouble speaking, slurred speech, or trouble understanding what people say. ? T - Time. Time to call emergency services. Write down what time symptoms started.  You have other signs of a stroke, such as: ? A sudden, very bad headache with no known cause. ? Feeling sick to your stomach. ? Throwing up. ? Jerky movements you cannot control (seizure). These symptoms  may be an emergency. Do not wait to see if the symptoms will go away. Get medical help right away. Call your local emergency services (911 in the [place] Do not drive yourself to the hospital. Summary  Subarachnoid hemorrhage is bleeding in the brain. It is an emergency. You must be treated in the hospital right away.  Follow instructions from your doctor about eating, resting, and taking medicines.  Do not take any medicines that contain aspirin or NSAIDs (like ibuprofen) unless your doctor says that it is safe to take them. This information is not intended to replace advice given to you by your health care provider. Make sure you discuss any questions you have with your health care provider. Document Released: 02/16/2013 Document Revised: 10/04/2017 Document Reviewed: 08/01/2017 Elsevier Patient Education  2020 [place].   Subarachnoid Hemorrhage  Subarachnoid hemorrhage is bleeding between the brain and the layer that covers the brain. The bleeding puts pressure on the brain, and it stops blood from going to some areas of the brain. If this bleeding is not treated, it may cause brain damage, stroke, or death. This is an emergency. You must be treated in the hospital right away. You are more likely to get this condition if you:  Smoke.  Have high blood pressure.  Drink too much alcohol.  Are older than age 52.  Are female, especially if you have stopped getting your period for a year or longer (menopause).  Have a family history of burst blood vessels (aneurysms).  Have a certain syndrome that leads to one of these: ? Kidney disease. ? Disease of tissues like bones, blood, and fat (connective tissues). Signs of this bleeding condition include:  Sudden, very bad headache. It may feel like the worst headache you have ever had.  Feeling sick to your stomach (nausea) or throwing up (vomiting), especially if you have other signs such as a headache.  Sudden weakness or loss of  feeling (numbness) in your face, arm, or leg, especially on one side of the body.  Sudden trouble with any of these: ? Walking. ? Moving an arm or leg. ? Talking. ? Understanding what people say. ? Swallowing. ? Seeing out of one eye or both eyes.  Sudden confusion.  Seeing double.  Loss of balance.  Sensitivity to light.  Stiff neck.  Trouble staying awake.  Passing out (fainting). Follow these instructions at home: Medicines  Take over-the-counter and prescription medicines only as told by your doctor.  Do not take any medicines that contain aspirin or NSAIDs (like ibuprofen) unless your doctor says that it is safe to take them. Lifestyle  Do not use any products that have nicotine or tobacco. These include cigarettes and e-cigarettes. If you need help quitting, ask your doctor.  Limit alcohol to 1 drink a day for nonpregnant women and 2 drinks a day  for men. One drink is equal to: ? 12 oz of beer. ? 5 oz of wine. ? 1 oz of hard liquor. Eating and drinking  Ask your doctor if it is safe for you to eat and drink. You may need tests to make sure that you can swallow safely (swallow studies). Driving  Do not drive until your doctor says that it is safe to drive.  Do not drive or use heavy machinery while taking prescription pain medicine. General instructions  Do therapy as recommended. This may include: ? Physical therapy (PT). ? Occupational therapy (OT). ? Speech-language therapy.  Rest and limit activity as told by your doctor. Rest helps your brain to heal. Make sure you: ? Get plenty of sleep. ? Avoid activities that cause stress to your body or mind.  Check your blood pressure as told by your doctor. Write down your blood pressure.  Keep all follow-up visits as told by your doctors. This is important. Contact a doctor if:  You have a stiff neck.  You have a cough.  You have a fever. Get help right away if:  You have any signs of a stroke. "BE  FAST" is an easy way to remember the main warning signs: ? B - Balance. Signs are dizziness, sudden trouble walking, or loss of balance. ? E - Eyes. Signs are trouble seeing or a sudden change in how you see. ? F - Face. Signs are sudden weakness or loss of feeling of the face, or the face or eyelid drooping on one side. ? A - Arms. Signs are weakness or loss of feeling in an arm. This happens suddenly and usually on one side of the body. ? S - Speech. Signs are sudden trouble speaking, slurred speech, or trouble understanding what people say. ? T - Time. Time to call emergency services. Write down what time symptoms started.  You have other signs of a stroke, such as: ? A sudden, very bad headache with no known cause. ? Feeling sick to your stomach. ? Throwing up. ? Jerky movements you cannot control (seizure). These symptoms may be an emergency. Do not wait to see if the symptoms will go away. Get medical help right away. Call your local emergency services (911 in the [place] Do not drive yourself to the hospital. Summary  Subarachnoid hemorrhage is bleeding in the brain. It is an emergency. You must be treated in the hospital right away.  Follow instructions from your doctor about eating, resting, and taking medicines.  Do not take any medicines that contain aspirin or NSAIDs (like ibuprofen) unless your doctor says that it is safe to take them. This information is not intended to replace advice given to you by your health care provider. Make sure you discuss any questions you have with your health care provider. Document Released: 02/16/2013 Document Revised: 10/04/2017 Document Reviewed: 08/01/2017 Elsevier Patient Education  2020 [employer]

## 2019-07-13 NOTE — Progress Notes (Signed)
Subjective: The patient is alert and pleasant.  She has a mild headache intermittently.  She looks well.  Objective: Vital signs in last 24 hours: Temp:  [36.7 C-37.2 C] 36.8 C (09/07 0800) Pulse Rate:  [47-76] 48 (09/07 0700) Resp:  [13-44] 16 (09/07 0700) BP: (98-133)/(53-88) 101/88 (09/07 0700) SpO2:  [92 %-100 %] 100 % (09/07 0700) There is no height or weight on file to calculate BMI.   Intake/Output from previous day: 09/06 0701 - 09/07 0700 In: 2655.7 [P.O.:125; I.V.:2530.7] Out: -  Intake/Output this shift: No intake/output data recorded.  Physical exam patient is alert and oriented x3.  Her speech and strength is normal.  Lab Results: Recent Labs    07/10/19 1323  WBC 8.1  HGB 13.8  HCT 41.0  PLT 212   BMET Recent Labs    07/10/19 1323  NA 138  K 3.8  CL 105  CO2 26  GLUCOSE 127*  BUN 19  CREATININE 0.88  CALCIUM 9.2    Studies/Results: No results found.  Assessment/Plan: Subarachnoid hemorrhage: Plan is to proceed with an arteriogram later this week.    Ophelia Charter 07/13/2019, 8:55 AM

## 2019-07-14 ENCOUNTER — Other Ambulatory Visit: Payer: Self-pay

## 2019-07-14 NOTE — Progress Notes (Signed)
Subjective: Patient reports Overall doing well had worsening headache is stable much better today  Objective: Vital signs in last 24 hours: Temp:  [97.5 F (36.4 C)-98.9 F (37.2 C)] 98.4 F (36.9 C) (09/08 0800) Pulse Rate:  [47-75] 49 (09/08 1100) Resp:  [13-36] 13 (09/08 1100) BP: (88-131)/(56-73) 108/67 (09/08 1100) SpO2:  [97 %-100 %] 100 % (09/08 1100)  Intake/Output from previous day: 09/07 0701 - 09/08 0700 In: 1777.3 [I.V.:1777.3] Out: -  Intake/Output this shift: Total I/O In: 299.8 [I.V.:299.8] Out: -   Awake and alert oriented moves all extremities well 5/5 strength  Lab Results: No results for input(s): WBC, HGB, HCT, PLT in the last 72 hours. BMET No results for input(s): NA, K, CL, CO2, GLUCOSE, BUN, CREATININE, CALCIUM in the last 72 hours.  Studies/Results: No results found.  Assessment/Plan: Continue observation for grade 1 subarachnoid hemorrhage discussed with Dr. Kathyrn Sheriff timing of angiography he will review the films and get back with [place].  LOS: 4 days     Tahiry Spicer P 07/14/2019, 11:44 AM

## 2019-07-15 MED ORDER — INFLUENZA VAC SPLIT QUAD 0.5 ML IM SUSY
0.5000 mL | PREFILLED_SYRINGE | INTRAMUSCULAR | Status: DC
  Filled 2019-07-15: qty 0.5

## 2019-07-15 NOTE — Progress Notes (Signed)
  NEUROSURGERY PROGRESS NOTE   Pt seen and examined. No issues overnight. Mild HA, significantly improved from onset.  EXAM: Temp:  [97.8 F (36.6 C)-98.3 F (36.8 C)] 97.8 F (36.6 C) (09/09 0400) Pulse Rate:  [46-75] 50 (09/09 0800) Resp:  [13-31] 19 (09/09 0800) BP: (92-122)/(56-82) 114/63 (09/09 0800) SpO2:  [97 %-100 %] 98 % (09/09 0800) Intake/Output      09/08 0701 - 09/09 0700 09/09 0701 - 09/10 0700   P.O. 840    I.V. (mL/kg) 1504.5 (28.8) 126.2 (2.4)   Total Intake(mL/kg) 2344.5 (44.9) 126.2 (2.4)   Net +2344.5 +126.2        Urine Occurrence 6 x     Awake, alert, oriented CN grossly intact Good strength throughout  LABS: Lab Results  Component Value Date   CREATININE 0.88 07/10/2019   BUN 19 07/10/2019   NA 138 07/10/2019   K 3.8 07/10/2019   CL 105 07/10/2019   CO2 26 07/10/2019   Lab Results  Component Value Date   WBC 8.1 07/10/2019   HGB 13.8 07/10/2019   HCT 41.0 07/10/2019   MCV 96.7 07/10/2019   PLT 212 07/10/2019    IMAGING: CT demonstrates basal SAH without HCP. CTA does not demonstrate any identifiable intracranial aneurysms, no evidence of AVM or fistula.  IMPRESSION: - 53 y.o. female Brent d# 6 CTA negative, doing well  PLAN: - Cont supportive care - Will plan on angiogram tomorrow

## 2019-07-16 ENCOUNTER — Inpatient Hospital Stay (HOSPITAL_COMMUNITY): Payer: BC Managed Care – PPO

## 2019-07-16 ENCOUNTER — Encounter (HOSPITAL_COMMUNITY): Payer: Self-pay | Admitting: Neurosurgery

## 2019-07-16 HISTORY — PX: IR ANGIO INTRA EXTRACRAN SEL INTERNAL CAROTID BILAT MOD SED: IMG5363

## 2019-07-16 HISTORY — PX: IR ANGIO VERTEBRAL SEL VERTEBRAL BILAT MOD SED: IMG5369

## 2019-07-16 MED ORDER — HEPARIN SODIUM (PORCINE) 1000 UNIT/ML IJ SOLN
INTRAMUSCULAR | Status: AC
  Filled 2019-07-16: qty 1

## 2019-07-16 MED ORDER — LIDOCAINE HCL 1 % IJ SOLN
INTRAMUSCULAR | Status: AC
  Filled 2019-07-16: qty 20

## 2019-07-16 MED ORDER — LIDOCAINE HCL 1 % IJ SOLN
INTRAMUSCULAR | Status: AC | PRN
  Administered 2019-07-16: 10 mL

## 2019-07-16 MED ORDER — FENTANYL CITRATE (PF) 100 MCG/2ML IJ SOLN
INTRAMUSCULAR | Status: AC
  Filled 2019-07-16: qty 2

## 2019-07-16 MED ORDER — HEPARIN SODIUM (PORCINE) 1000 UNIT/ML IJ SOLN
INTRAMUSCULAR | Status: AC | PRN
  Administered 2019-07-16: 2000 [IU] via INTRAVENOUS

## 2019-07-16 MED ORDER — MIDAZOLAM HCL 2 MG/2ML IJ SOLN
INTRAMUSCULAR | Status: AC
  Filled 2019-07-16: qty 2

## 2019-07-16 MED ORDER — SODIUM CHLORIDE 0.9 % IV SOLN
INTRAVENOUS | Status: DC
  Administered 2019-07-16 – 2019-07-17 (×2): via INTRAVENOUS

## 2019-07-16 MED ORDER — IOHEXOL 300 MG/ML  SOLN
100.0000 mL | Freq: Once | INTRAMUSCULAR | Status: AC | PRN
  Administered 2019-07-16: 36 mL via INTRA_ARTERIAL

## 2019-07-16 MED ORDER — FENTANYL CITRATE (PF) 100 MCG/2ML IJ SOLN
INTRAMUSCULAR | Status: AC | PRN
  Administered 2019-07-16: 25 ug via INTRAVENOUS

## 2019-07-16 MED ORDER — MIDAZOLAM HCL 2 MG/2ML IJ SOLN
INTRAMUSCULAR | Status: AC | PRN
  Administered 2019-07-16: 1 mg via INTRAVENOUS

## 2019-07-16 MED ORDER — HYDROCODONE-ACETAMINOPHEN 5-325 MG PO TABS: 1.0000 | ORAL_TABLET | ORAL | Status: DC | PRN

## 2019-07-16 NOTE — Plan of Care (Signed)
Patient is vocal when requiring pain medication for headache. Patient using the call bell system and using the pain scale properly.

## 2019-07-16 NOTE — Progress Notes (Signed)
  NEUROSURGERY PROGRESS NOTE   No issues overnight. Cont mild HA, otherwise no complaints.  EXAM:  BP 122/70   Pulse 60   Temp (P) 98 F (36.7 C) (Oral)   Resp 14   Ht 5\' 2"  (1.575 m)   Wt 52 kg   SpO2 100%   BMI 20.97 kg/m   Awake, alert, oriented  Speech fluent, appropriate  CN grossly intact  5/5 BUE/BLE   IMPRESSION:  53 y.o. female CTA negative SAH d# 7, doing well  PLAN: - Will plan on diagnostic angiogram today  I have reviewed the indications, risks, benefits, and alternatives with the patient. All questions today were answered.

## 2019-07-16 NOTE — Brief Op Note (Signed)
  NEUROSURGERY BRIEF OPERATIVE  NOTE   PREOP DX: Subarachnoid hemorrhage  POSTOP DX: Same  PROCEDURE: Diagnostic cerebral angiogram  SURGEON: Dr. Consuella Lose, MD  ANESTHESIA: IV Sedation with Local  EBL: Minimal  SPECIMENS: None  COMPLICATIONS: None  CONDITION: Stable to med-surg floor  FINDINGS (Full report in [facility]): 1. Normal cerebral angiogram without intracranial aneurysms, AVM, or high-flow fistulas seen.

## 2019-07-17 NOTE — Progress Notes (Signed)
  NEUROSURGERY PROGRESS NOTE   Fairly severe headache overnight, but resolved with medication No significant headache today Is concerned about going home since her husband just had knee surgery  EXAM:  BP 116/71 (BP Location: Left Arm)   Pulse 61   Temp 98.3 F (36.8 C)   Resp 16   Ht 5\' 2"  (1.575 m)   Wt 52 kg   SpO2 99%   BMI 20.97 kg/m   Awake, alert, oriented  Speech fluent, appropriate  CN grossly intact  5/5 BUE/BLE   IMPRESSION/PLAN 52 y.o. female angio negative SAH d#8. Doing well. - Work with therapy today - Anticipate d/c tomorrow

## 2019-07-17 NOTE — Evaluation (Signed)
Physical Therapy Evaluation Patient Details Name: Ashley Case MRN: [id number] DOB: May 21, 1966 Today's Date: 07/17/2019   History of Present Illness  Pt is a 53 y/o female admitted secondary to worsening headache. Found to have SAH in basal cisterns. Pt is s/p diagnostic cerebral angiogram which was negative for aneurysm. Pt with no pertinent PMH.   Clinical Impression  Pt admitted secondary to problem above with deficits below. Pt requiring min guard for gait this session. Pt with sporadic sharp pain in head and bilateral thighs and required 2 standing rests. Educated about activity pacing at home. Also used sunglasses to help with sensitivity to light. Will continue to follow acutely to maximize functional mobility independence and safety.     Follow Up Recommendations No PT follow up;Supervision for mobility/OOB    Equipment Recommendations  None recommended by PT    Recommendations for Other Services       Precautions / Restrictions Precautions Precautions: Fall Restrictions Weight Bearing Restrictions: No      Mobility  Bed Mobility Overal bed mobility: Independent                Transfers Overall transfer level: Needs assistance Equipment used: None Transfers: Sit to/from Stand Sit to Stand: Min guard         General transfer comment: Min guard for safety.   Ambulation/Gait Ambulation/Gait assistance: Min guard Gait Distance (Feet): 125 Feet Assistive device: None Gait Pattern/deviations: Step-through pattern;Decreased stride length Gait velocity: Decreased    General Gait Details: Slow, very guarded gait. Pt required standing rest X2 secondary to sharp pain in BLE and head during mobility. Occasionally reaching for rail in hall for support. Educated about activity pacing at home.   Stairs            Wheelchair Mobility    Modified Rankin (Stroke Patients Only)       Balance Overall balance assessment: Needs assistance Sitting-balance  support: No upper extremity supported;Feet supported Sitting balance-Leahy Scale: Good     Standing balance support: No upper extremity supported;During functional activity Standing balance-Leahy Scale: Fair                               Pertinent Vitals/Pain Pain Assessment: Faces Faces Pain Scale: Hurts whole lot Pain Location: head and BLE into thighs intermittently  Pain Descriptors / Indicators: Sharp;Grimacing Pain Intervention(s): Monitored during session;Limited activity within patient's tolerance;Repositioned    Home Living Family/patient expects to be discharged to:: Private residence Living Arrangements: Spouse/significant other;Children Available Help at Discharge: Family Type of Home: House Home Access: Stairs to enter Entrance Stairs-Rails: Doctor, general practiceight;Left Entrance Stairs-Number of Steps: 4 Home Layout: Two level Home Equipment: None Additional Comments: Pt's husband is awaiting knee surgery, so unable to provide physical assist. Does have a 52 year old and 52 year old son.     Prior Function Level of Independence: Independent         Comments: Was working as a Runner, broadcasting/film/videoteacher.      Hand Dominance        Extremity/Trunk Assessment   Upper Extremity Assessment Upper Extremity Assessment: Defer to OT evaluation    Lower Extremity Assessment Lower Extremity Assessment: RLE deficits/detail;LLE deficits/detail RLE Deficits / Details: Pt reporting intermittent, sporadic pain in BLE from hip into back of mid thigh during mobility.  LLE Deficits / Details: Pt reporting intermittent, sporadic pain in BLE from hip into back of mid thigh during mobility.  Cervical / Trunk Assessment Cervical / Trunk Assessment: Normal  Communication   Communication: No difficulties  Cognition Arousal/Alertness: Awake/alert Behavior During Therapy: WFL for tasks assessed/performed Overall Cognitive Status: Within Functional Limits for tasks assessed                                         General Comments General comments (skin integrity, edema, etc.): Pt reporting sensistivity to light, so had pt wear sunglassess during mobility. Educated about activity pacing at home.     Exercises     Assessment/Plan    PT Assessment Patient needs continued PT services  PT Problem List Decreased strength;Decreased mobility;Decreased activity tolerance;Decreased knowledge of precautions;Pain       PT Treatment Interventions Gait training;Stair training;Functional mobility training;Therapeutic activities;Therapeutic exercise;Patient/family education    PT Goals (Current goals can be found in the Care Plan section)  Acute Rehab PT Goals Patient Stated Goal: for pain to improve PT Goal Formulation: With patient Time For Goal Achievement: 07/31/19 Potential to Achieve Goals: Good    Frequency Min 3X/week   Barriers to discharge Other (comment) Pt's husband awaiting knee surgery and unable to physically assist.     Co-evaluation               AM-PAC PT "6 Clicks" Mobility  Outcome Measure Help needed turning from your back to your side while in a flat bed without using bedrails?: None Help needed moving from lying on your back to sitting on the side of a flat bed without using bedrails?: None Help needed moving to and from a bed to a chair (including a wheelchair)?: A Little Help needed standing up from a chair using your arms (e.g., wheelchair or bedside chair)?: A Little Help needed to walk in hospital room?: A Little Help needed climbing 3-5 steps with a railing? : A Little 6 Click Score: 20    End of Session Equipment Utilized During Treatment: Gait belt Activity Tolerance: Patient limited by pain Patient left: in bed;with call bell/phone within reach Nurse Communication: Mobility status PT Visit Diagnosis: Other abnormalities of gait and mobility (R26.89);Pain Pain - part of body: (head, bilateral thighs)    Time:  1430-1455 PT Time Calculation (min) (ACUTE ONLY): 25 min   Charges:   PT Evaluation $PT Eval Moderate Complexity: 1 Mod PT Treatments $Gait Training: 8-22 mins        Leighton Ruff, PT, DPT  Acute Rehabilitation Services  Pager: [phone] Office: [phone]   Ashley Case 07/17/2019, 3:37 PM

## 2019-07-18 MED ORDER — NIMODIPINE 30 MG PO CAPS: 60.0000 mg | ORAL_CAPSULE | ORAL | 0 refills | Status: AC

## 2019-07-18 MED ORDER — HYDROCODONE-ACETAMINOPHEN 5-325 MG PO TABS: 1.0000 | ORAL_TABLET | ORAL | 0 refills | Status: DC | PRN

## 2019-07-18 MED ORDER — HYDROCODONE-ACETAMINOPHEN 5-325 MG PO TABS: 1.0000 | ORAL_TABLET | ORAL | 0 refills | Status: AC | PRN

## 2019-07-18 MED ORDER — ONDANSETRON HCL 4 MG PO TABS: 4.0000 mg | ORAL_TABLET | Freq: Four times a day (QID) | ORAL | 0 refills | Status: DC | PRN

## 2019-07-18 MED ORDER — INFLUENZA VAC SPLIT QUAD 0.5 ML IM SUSY
0.5000 mL | PREFILLED_SYRINGE | Freq: Once | INTRAMUSCULAR | Status: AC
  Administered 2019-07-18: 09:00:00 0.5 mL via INTRAMUSCULAR
  Filled 2019-07-18: qty 0.5

## 2019-07-18 MED ORDER — NIMODIPINE 30 MG PO CAPS: 60.0000 mg | ORAL_CAPSULE | ORAL | 0 refills | Status: DC

## 2019-07-18 MED ORDER — ONDANSETRON HCL 4 MG PO TABS: 4.0000 mg | ORAL_TABLET | Freq: Four times a day (QID) | ORAL | 0 refills | Status: AC | PRN

## 2019-07-18 NOTE — Discharge Summary (Signed)
  Physician Discharge Summary  Patient ID: Ashley Case MRN: [id number] DOB/AGE: 52/22/1967 52 y.o. Estimated body mass index is 20.97 kg/m as calculated from the following:   Height as of this encounter: 5\' 2"  (1.575 m).   Weight as of this encounter: 52 kg.   Admit date: 07/10/2019 Discharge date: 07/18/2019  Admission Diagnoses: Grade 1 subarachnoid hemorrhage  Discharge Diagnoses: Same Active Problems:   SAH (subarachnoid hemorrhage) ([place])   Discharged Condition: good  Hospital Course: Patient is awake and alert having some headache angiogram was negative patient is been cleared for discharge we will discharge the patient on the motor pain Zofran and Vicodin alternating with Tylenol for pain.  Patient was follow-up with Dr. Kathyrn Sheriff in 1 to 2 weeks.  Consults: Significant Diagnostic Studies: Treatments: Angiography Discharge Exam: Blood pressure (!) 103/54, pulse 66, temperature 99.3 F (37.4 C), temperature source Oral, resp. rate 18, height 5\' 2"  (1.575 m), weight 52 kg, SpO2 99 %. Awake alert oriented strength 5 out of 5 all  Disposition: Home   Allergies as of 07/18/2019   No Known Allergies     Medication List    TAKE these medications   ibuprofen 200 MG tablet Commonly known as: ADVIL Take 400 mg by mouth every 6 (six) hours as needed for fever, headache or moderate pain.   Lo Loestrin Fe 1 MG-10 MCG / 10 MCG tablet Generic drug: Norethindrone-Ethinyl Estradiol-Fe Biphas Take 1 tablet by mouth daily.   multivitamin with minerals Tabs tablet Take 2 tablets by mouth daily.   niMODipine 30 MG capsule Commonly known as: NIMOTOP Take 2 capsules (60 mg total) by mouth every 4 (four) hours.   ondansetron 4 MG tablet Commonly known as: ZOFRAN Take 1 tablet (4 mg total) by mouth every 6 (six) hours as needed for nausea.        Signed: Williard Keller P 07/18/2019, 7:30 AM

## 2019-07-18 NOTE — Progress Notes (Signed)
OT Cancellation Note  Patient Details Name: Ashley Case MRN: [id number] DOB: 09/20/1966   Cancelled Treatment:    Reason Eval/Treat Not Completed: Other (comment).  Pt already discharged.  [name], OTR/L [place] Pager [phone] Office [phone]   [name] M 07/18/2019, 11:26 AM

## 2019-07-18 NOTE — TOC Transition Note (Signed)
Transition of Care [facility]) - CM/SW Discharge Note   Patient Details  Name: Ashley Case MRN: [id number] Date of Birth: [birth year]  [facility] CM/SW Contact:  Pollie Friar, RN Phone Number: 07/18/2019, 10:48 AM   Clinical Narrative:    Pt discharging home with self care. Pt has Nimodipine ordered for d/c medications. CM called her [facility] and they do not have the medication in stock and will not have any until Monday. CM found that [facility] on [address] has the medication in stock. MD [facility] to [facility] and CM cancelled meds at [facility]  Pt has transportation home.    Final next level of care: Home/Self Care Barriers to Discharge: No Barriers Identified   Patient Goals and CMS Choice        Discharge Placement                       Discharge Plan and Services                                     Social Determinants of Health (SDOH) Interventions     Readmission Risk Interventions No flowsheet data found.

## 2020-01-02 ENCOUNTER — Ambulatory Visit: Payer: BC Managed Care – PPO | Attending: Internal Medicine

## 2020-01-02 DIAGNOSIS — Z23 Encounter for immunization: Secondary | ICD-10-CM | POA: Insufficient documentation

## 2020-01-02 NOTE — Progress Notes (Signed)
   Covid-19 Vaccination Clinic  Name:  Saydie Gerdts    MRN: [id number] DOB: 11-Jan-1966  01/02/2020  Ms. Mansouri was observed post Covid-19 immunization for 15 minutes without incidence. She was provided with Vaccine Information Sheet and instruction to access the V-Safe system.   Ms. Mariner was instructed to call 911 with any severe reactions post vaccine: Marland Kitchen Difficulty breathing  . Swelling of your face and throat  . A fast heartbeat  . A bad rash all over your body  . Dizziness and weakness    Immunizations Administered    Name Date Dose VIS Date Route   Pfizer COVID-19 Vaccine 01/02/2020 11:27 AM 0.3 mL 10/16/2019 Intramuscular   Manufacturer: [employer], [employer]   Lot: [id number]   NDC: [address]-0

## 2020-01-23 ENCOUNTER — Ambulatory Visit: Payer: BC Managed Care – PPO | Attending: Internal Medicine

## 2020-01-23 DIAGNOSIS — Z23 Encounter for immunization: Secondary | ICD-10-CM

## 2020-01-23 NOTE — Progress Notes (Signed)
   Covid-19 Vaccination Clinic  Name:  Ashley Case    MRN: [id number] DOB: [birth year]  01/23/2020  Ms. Bahr was observed post Covid-19 immunization for 15 minutes without incident. She was provided with Vaccine Information Sheet and instruction to access the V-Safe system.   Ms. Ronning was instructed to call 911 with any severe reactions post vaccine: Marland Kitchen Difficulty breathing  . Swelling of face and throat  . A fast heartbeat  . A bad rash all over body  . Dizziness and weakness   Immunizations Administered    Name Date Dose VIS Date Route   Pfizer COVID-19 Vaccine 01/23/2020 11:09 AM 0.3 mL 10/16/2019 Intramuscular   Manufacturer: [employer], [employer]   Lot: [id number]   NDC: [address]-7

## 2020-01-27 ENCOUNTER — Ambulatory Visit: Payer: BC Managed Care – PPO

## 2020-07-19 ENCOUNTER — Other Ambulatory Visit: Payer: BC Managed Care – PPO

## 2021-02-03 IMAGING — XA IR ANGIO VETEBRAL SEL VERTEBRAL BILAT MOD SED
8 of 10 series · 13 of 24 positions shown · IV contrast (IODINE)
Comparison: none

PROCEDURE:
DIAGNOSTIC CEREBRAL ANGIOGRAM
HISTORY: The patient is a 53-year-old woman presenting to the hospital with
sudden onset of severe headache. Initial CT scan did demonstrate
diffuse subarachnoid hemorrhage, with CT angiogram unrevealing for
intracranial aneurysm or AVM. She therefore presents today for
follow-up diagnostic cerebral angiogram.
TECHNIQUE: CATHETERS AND WIRES
5-French JB-1 catheter

[Series 1: cerebral care 2 · 2 acquisitions, 2 frames shown (1 of 6)]
[im 1/2]
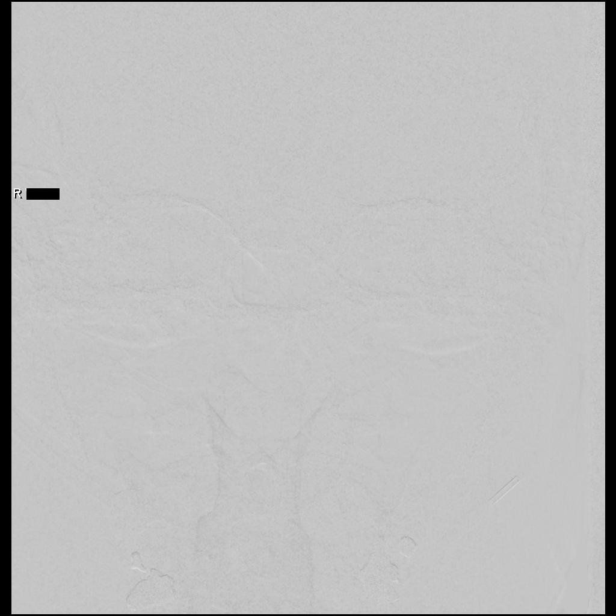
[im 2/2]
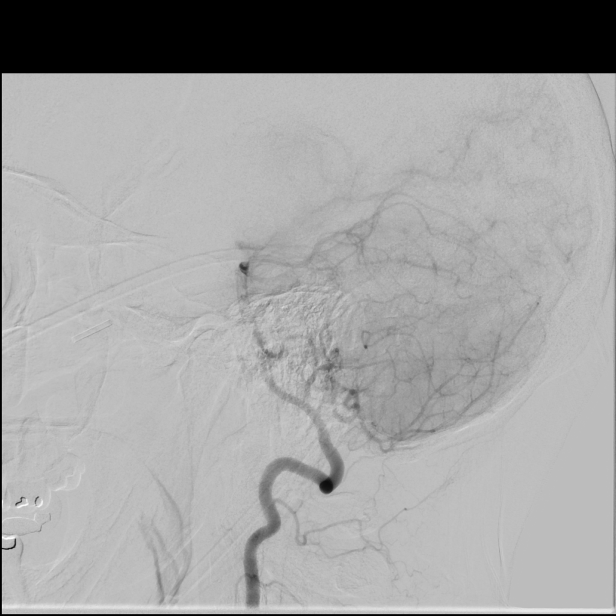

[Series 2: cerebral care 2 · 2 acquisitions, 2 frames shown (2 of 6)]
[im 1/2]
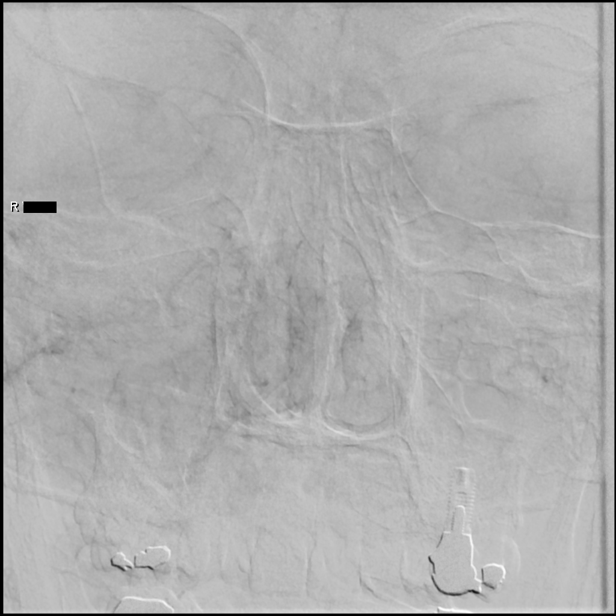
[im 2/2]
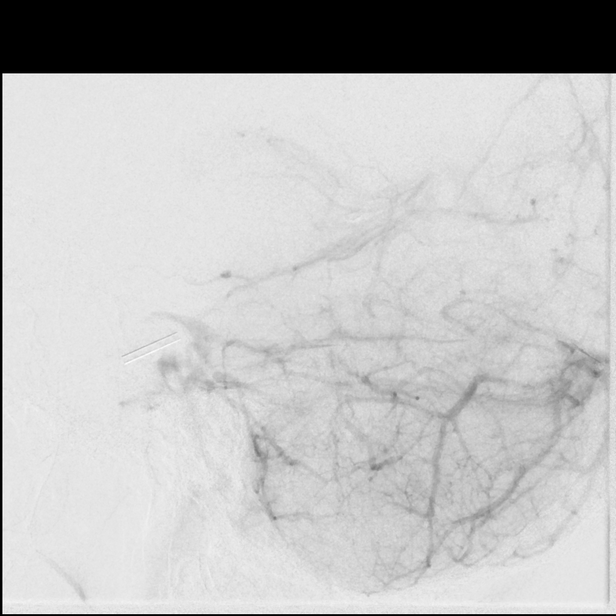

[Series 3: cerebral care 2 · 2 acquisitions, 1 frame shown (3 of 6)]
[im 2/2]
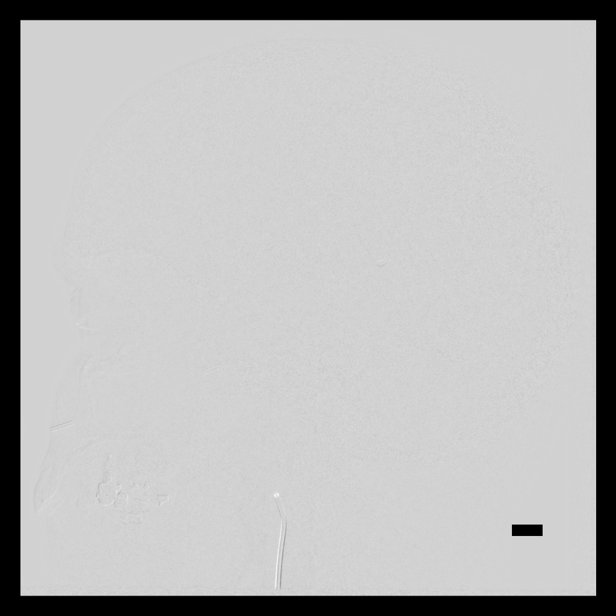

[Series 4: cerebral care 2 · 2 acquisitions, 2 frames shown (4 of 6)]
[im 1/2]
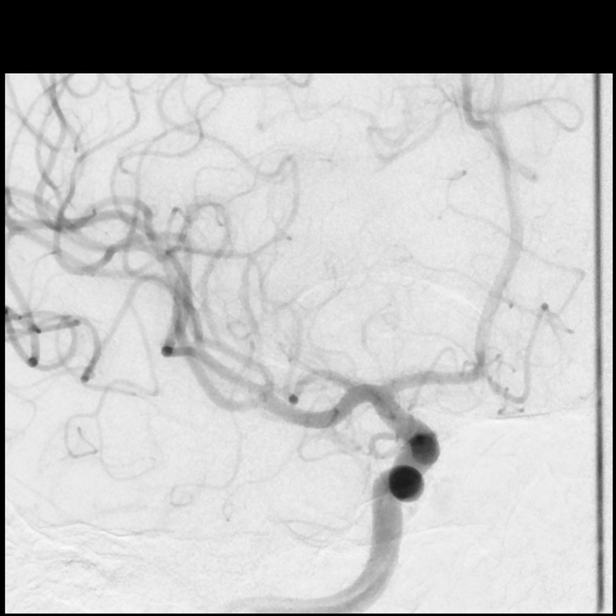
[im 2/2]
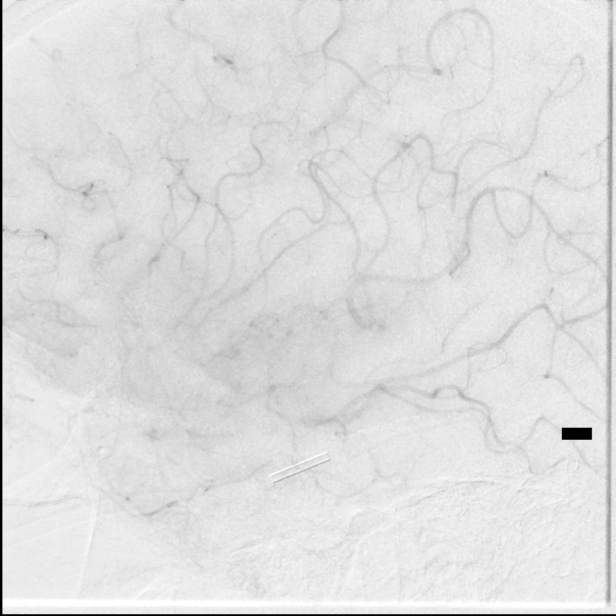

[Series 5: cerebral care 2 · 2 acquisitions, 1 frame shown (5 of 6)]
[im 1/2]
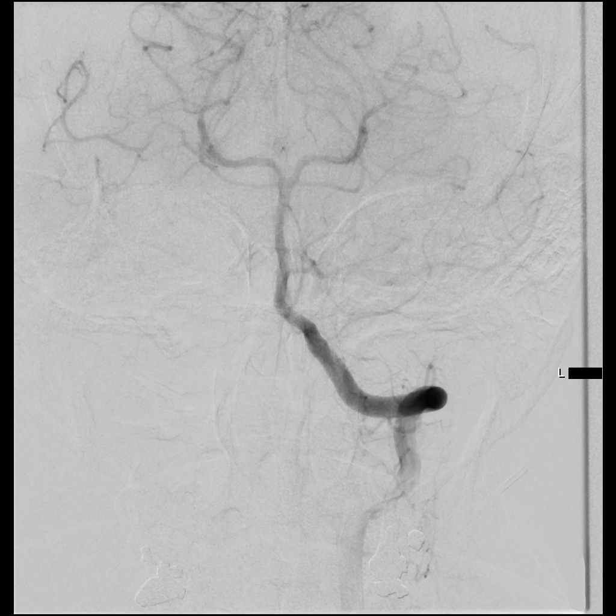

[Series 6: cerebral care 2 · 2 acquisitions, 1 frame shown (6 of 6)]
[im 1/2]
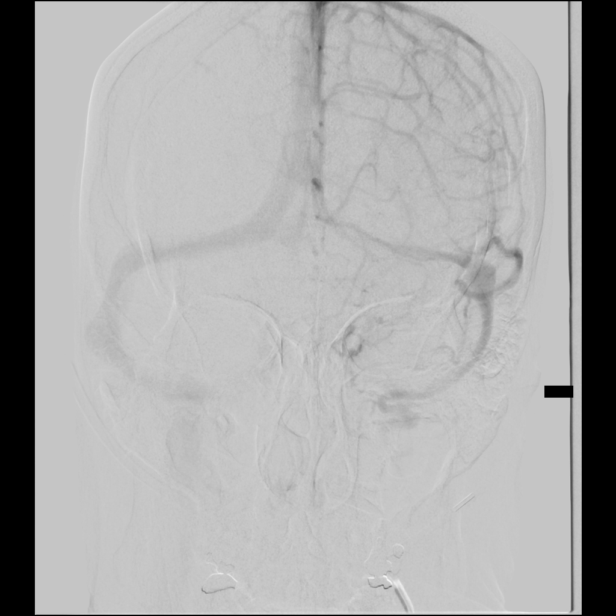

[Series 8: single · 1 of 2 slices shown]
[im 1/2]
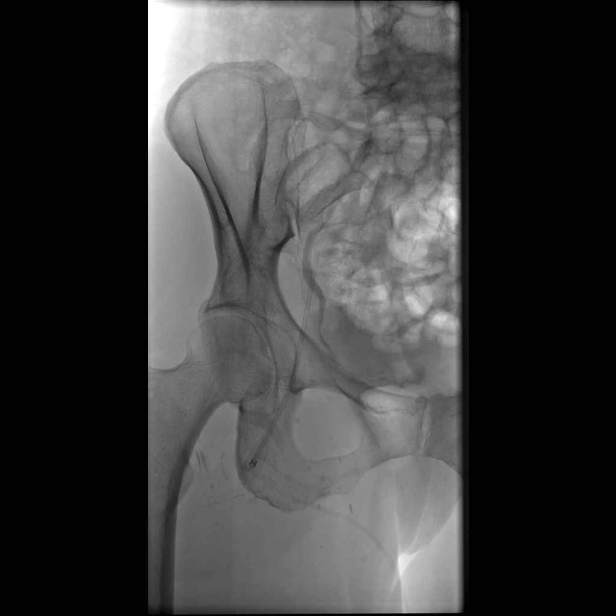

[Series 300: dr. (person_name) · 3 of 16 slices shown]
[im 1/16]
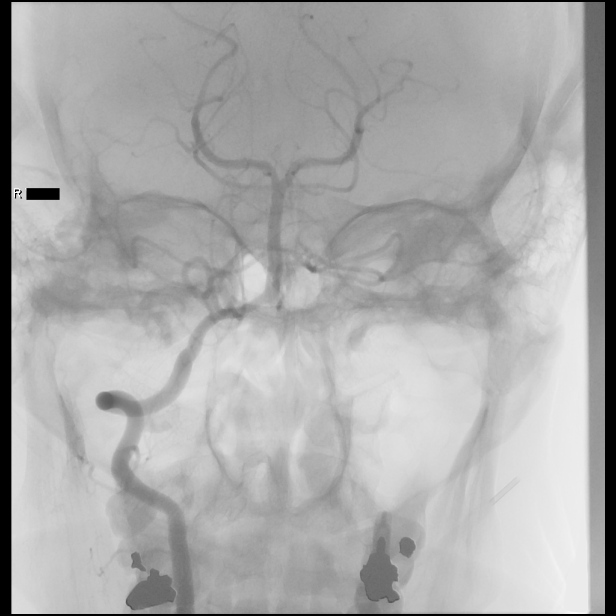
[im 8/16]
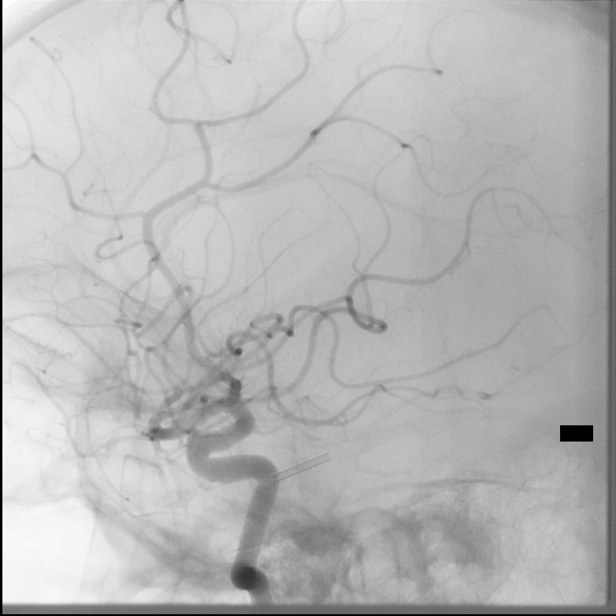
[im 16/16]
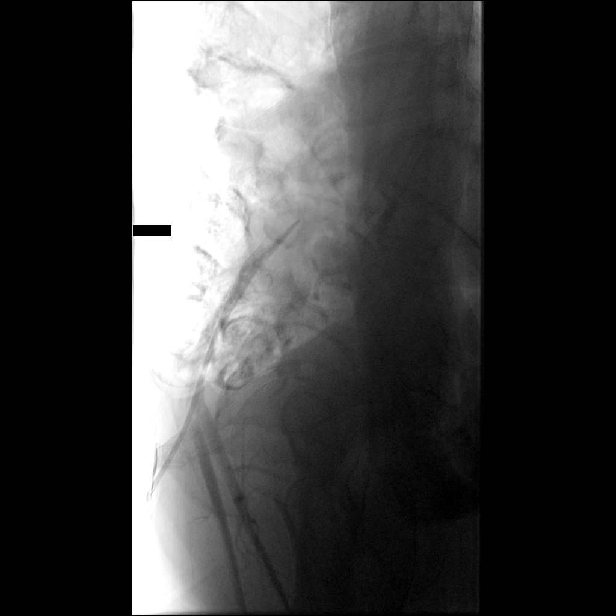

[13 of 24 positions shown; findings below may reference images not displayed]

ACCESS:
The technical aspects of the procedure as well as its potential
risks and benefits were reviewed with the patient. These risks
included but were not limited bleeding, infection, allergic
reaction, damage to organs or vital structures, stroke,
non-diagnostic procedure, and the catastrophic outcomes of heart
attack, coma, and death. With an understanding of these risks,
informed consent was obtained and witnessed. The patient was placed
in the supine position on the angiography table and the skin of
right groin prepped in the usual sterile fashion. The procedure was
performed under local anesthesia (1%-solution of
bicarbonate-buffered Lidocaine) and conscious sedation with Versed
and fentanyl monitored by the in-suite nurse. A 5- French sheath was
introduced in the right common femoral artery using Seldinger
technique. A fluoro-phase sequence was used to document the sheath
position.

MEDICATIONS:
HEPARIN: 5111 Units total.

CONTRAST:  36mL OMNIPAQUE IOHEXOL 300 MG/ML  SOLNcc, Omnipaque 300

FLUOROSCOPY TIME:  FLUOROSCOPY TIME: See IR records
0.035" glidewire

VESSELS CATHETERIZED
Right internal carotid

Left internal carotid

Left vertebral

Right vertebral

Right common femoral

VESSELS STUDIED
Right internal carotid, head

Left internal carotid, head

Left vertebral

Right vertebral

Right femoral

PROCEDURAL NARRATIVE
A 5-Fr JB-1 catheter was advanced over a 0.035 glidewire into the
aortic arch. The above vessels were then sequentially catheterized
and cervical / cerebral angiograms taken. After review of images,
the catheter was removed without incident.
FINDINGS: Right internal carotid: head:

Injection reveals the presence of a widely patent ICA, M1, and A1
segments and their branches. No aneurysms, AVMs, or high-flow
fistulas are seen. There is no vasospasm of the right carotid
circulation. The parenchymal and venous phases are normal. The
venous sinuses are widely patent.

Left internal carotid: head:

Injection reveals the presence of a widely patent ICA, A1, and M1
segments and their branches. No aneurysms, AVMs, or high-flow
fistulas are seen. There is no vasospasm seen. The parenchymal and
venous phases are normal. The venous sinuses are widely patent.

Right vertebral:

Injection reveals the presence of a widely patent vertebral artery.
This leads to a widely patent basilar artery that terminates in
bilateral P1. The basilar apex is normal. No aneurysms, AVMs, or
high-flow fistulas are seen. The parenchymal and venous phases are
normal. The venous sinuses are widely patent.

Left vertebral:

The vertebral artery is widely patent, with direct origin from the
aortic arch. No PICA aneurysm is seen. See basilar description
above.

Right femoral:

Normal vessel. No significant atherosclerotic disease. Arterial
sheath in adequate position.

DISPOSITION:
Upon completion of the study, the femoral sheath was removed and
hemostasis obtained using a 5-Fr ExoSeal closure device. Good
proximal and distal lower extremity pulses were documented upon
achievement of hemostasis. The procedure was well tolerated and no
early complications were observed. The patient was transferred to
the holding area to lay flat for 2 hours.
IMPRESSION: 1. Normal cerebral angiogram, without angiographic evidence of
intracranial aneurysm, arteriovenous malformation, or high-flow
fistula. There is no vasospasm.

The preliminary results of this procedure were shared with the
patient and the patient's family.

## 2023-07-31 ENCOUNTER — Other Ambulatory Visit: Payer: Self-pay | Admitting: Family Medicine

## 2023-07-31 DIAGNOSIS — R109 Unspecified abdominal pain: Secondary | ICD-10-CM

## 2023-08-23 ENCOUNTER — Ambulatory Visit
Admission: RE | Admit: 2023-08-23 | Discharge: 2023-08-23 | Disposition: A | Discharge status: Home / Self Care | Payer: BC Managed Care – PPO | Source: Ambulatory Visit | Attending: Family Medicine | Admitting: Family Medicine

## 2023-08-23 DIAGNOSIS — R109 Unspecified abdominal pain: Secondary | ICD-10-CM
